# Patient Record
Sex: Female | Born: 1950 | Race: White | Hispanic: No | Marital: Married | Smoking: Never smoker
Health system: Southern US, Community
[De-identification: ages and names within clinical notes are randomized; demographics above are authoritative.]

## PROBLEM LIST (undated history)

## (undated) DIAGNOSIS — IMO0002 Reserved for concepts with insufficient information to code with codable children: Secondary | ICD-10-CM

## (undated) DIAGNOSIS — N950 Postmenopausal bleeding: Secondary | ICD-10-CM

## (undated) DIAGNOSIS — M8580 Other specified disorders of bone density and structure, unspecified site: Secondary | ICD-10-CM

## (undated) DIAGNOSIS — N816 Rectocele: Secondary | ICD-10-CM

## (undated) DIAGNOSIS — G4700 Insomnia, unspecified: Secondary | ICD-10-CM

## (undated) HISTORY — DX: Other specified disorders of bone density and structure, unspecified site: M85.80

## (undated) HISTORY — DX: Rectocele: N81.6

## (undated) HISTORY — DX: Insomnia, unspecified: G47.00

## (undated) HISTORY — DX: Postmenopausal bleeding: N95.0

## (undated) HISTORY — DX: Reserved for concepts with insufficient information to code with codable children: IMO0002

---

## 1999-04-03 ENCOUNTER — Other Ambulatory Visit: Admission: RE | Admit: 1999-04-03 | Discharge: 1999-04-03 | Payer: Self-pay | Admitting: Obstetrics and Gynecology

## 1999-10-16 ENCOUNTER — Encounter: Payer: Self-pay | Admitting: Obstetrics and Gynecology

## 1999-10-16 ENCOUNTER — Encounter: Admission: RE | Admit: 1999-10-16 | Discharge: 1999-10-16 | Payer: Self-pay | Admitting: Obstetrics and Gynecology

## 2000-03-30 ENCOUNTER — Other Ambulatory Visit: Admission: RE | Admit: 2000-03-30 | Discharge: 2000-03-30 | Payer: Self-pay | Admitting: Obstetrics and Gynecology

## 2000-03-31 ENCOUNTER — Other Ambulatory Visit: Admission: RE | Admit: 2000-03-31 | Discharge: 2000-03-31 | Payer: Self-pay | Admitting: Obstetrics and Gynecology

## 2000-03-31 ENCOUNTER — Encounter (INDEPENDENT_AMBULATORY_CARE_PROVIDER_SITE_OTHER): Payer: Self-pay | Admitting: Specialist

## 2000-10-18 ENCOUNTER — Encounter: Admission: RE | Admit: 2000-10-18 | Discharge: 2000-10-18 | Payer: Self-pay | Admitting: Obstetrics and Gynecology

## 2000-10-18 ENCOUNTER — Encounter: Payer: Self-pay | Admitting: Obstetrics and Gynecology

## 2001-04-18 ENCOUNTER — Other Ambulatory Visit: Admission: RE | Admit: 2001-04-18 | Discharge: 2001-04-18 | Payer: Self-pay | Admitting: Obstetrics and Gynecology

## 2001-10-20 ENCOUNTER — Encounter: Admission: RE | Admit: 2001-10-20 | Discharge: 2001-10-20 | Payer: Self-pay | Admitting: Obstetrics and Gynecology

## 2001-10-20 ENCOUNTER — Encounter: Payer: Self-pay | Admitting: Obstetrics and Gynecology

## 2002-05-26 ENCOUNTER — Other Ambulatory Visit: Admission: RE | Admit: 2002-05-26 | Discharge: 2002-05-26 | Payer: Self-pay | Admitting: Obstetrics and Gynecology

## 2002-10-23 ENCOUNTER — Encounter: Payer: Self-pay | Admitting: Obstetrics and Gynecology

## 2002-10-23 ENCOUNTER — Encounter: Admission: RE | Admit: 2002-10-23 | Discharge: 2002-10-23 | Payer: Self-pay | Admitting: Obstetrics and Gynecology

## 2003-05-28 ENCOUNTER — Other Ambulatory Visit: Admission: RE | Admit: 2003-05-28 | Discharge: 2003-05-28 | Payer: Self-pay | Admitting: Obstetrics and Gynecology

## 2003-11-08 ENCOUNTER — Encounter: Admission: RE | Admit: 2003-11-08 | Discharge: 2003-11-08 | Payer: Self-pay | Admitting: Obstetrics and Gynecology

## 2004-04-21 ENCOUNTER — Other Ambulatory Visit: Admission: RE | Admit: 2004-04-21 | Discharge: 2004-04-21 | Payer: Self-pay | Admitting: Obstetrics and Gynecology

## 2004-11-11 ENCOUNTER — Encounter: Admission: RE | Admit: 2004-11-11 | Discharge: 2004-11-11 | Payer: Self-pay | Admitting: Obstetrics and Gynecology

## 2005-02-19 ENCOUNTER — Ambulatory Visit (HOSPITAL_COMMUNITY): Admission: RE | Admit: 2005-02-19 | Discharge: 2005-02-19 | Payer: Self-pay | Admitting: Gastroenterology

## 2005-04-28 ENCOUNTER — Other Ambulatory Visit: Admission: RE | Admit: 2005-04-28 | Discharge: 2005-04-28 | Payer: Self-pay | Admitting: Obstetrics and Gynecology

## 2006-01-01 ENCOUNTER — Encounter: Admission: RE | Admit: 2006-01-01 | Discharge: 2006-01-01 | Payer: Self-pay | Admitting: Obstetrics and Gynecology

## 2006-07-19 ENCOUNTER — Other Ambulatory Visit: Admission: RE | Admit: 2006-07-19 | Discharge: 2006-07-19 | Payer: Self-pay | Admitting: Obstetrics and Gynecology

## 2006-07-20 ENCOUNTER — Encounter: Admission: RE | Admit: 2006-07-20 | Discharge: 2006-07-20 | Payer: Self-pay | Admitting: Obstetrics and Gynecology

## 2007-01-03 ENCOUNTER — Encounter: Admission: RE | Admit: 2007-01-03 | Discharge: 2007-01-03 | Payer: Self-pay | Admitting: Obstetrics and Gynecology

## 2008-01-09 ENCOUNTER — Encounter: Admission: RE | Admit: 2008-01-09 | Discharge: 2008-01-09 | Payer: Self-pay | Admitting: Obstetrics and Gynecology

## 2009-01-09 ENCOUNTER — Encounter: Admission: RE | Admit: 2009-01-09 | Discharge: 2009-01-09 | Payer: Self-pay | Admitting: Obstetrics and Gynecology

## 2010-01-14 ENCOUNTER — Encounter: Admission: RE | Admit: 2010-01-14 | Discharge: 2010-01-14 | Payer: Self-pay | Admitting: Obstetrics and Gynecology

## 2010-12-29 ENCOUNTER — Other Ambulatory Visit: Payer: Self-pay | Admitting: Obstetrics and Gynecology

## 2010-12-29 DIAGNOSIS — Z1239 Encounter for other screening for malignant neoplasm of breast: Secondary | ICD-10-CM

## 2010-12-29 DIAGNOSIS — Z1231 Encounter for screening mammogram for malignant neoplasm of breast: Secondary | ICD-10-CM

## 2011-01-15 ENCOUNTER — Ambulatory Visit: Payer: Self-pay

## 2011-01-15 ENCOUNTER — Ambulatory Visit
Admission: RE | Admit: 2011-01-15 | Discharge: 2011-01-15 | Disposition: A | Discharge status: Home / Self Care | Payer: 59 | Source: Ambulatory Visit | Attending: Obstetrics and Gynecology | Admitting: Obstetrics and Gynecology

## 2011-01-15 DIAGNOSIS — Z1231 Encounter for screening mammogram for malignant neoplasm of breast: Secondary | ICD-10-CM

## 2011-04-24 NOTE — Op Note (Signed)
NAMEDONASIA, WIMES             ACCOUNT NO.:  [id number]   MEDICAL RECORD NO.:  [id number]          PATIENT TYPE:  AMB   LOCATION:  ENDO                         FACILITY:  [facility]   PHYSICIAN:  Bernette Redbird, M.D.   DATE OF BIRTH:  1951-09-28   DATE OF PROCEDURE:  02/19/2005  DATE OF DISCHARGE:                                 OPERATIVE REPORT   PROCEDURE:  Colonoscopy.   ENDOSCOPIST:  Bernette Redbird, M.D.   INDICATIONS:  Initial colon cancer screening exam in a 60 year old female  without worrisome risk factors or symptoms.   FINDINGS:  Normal exam to the terminal ileum.   PROCEDURE:  The nature, purpose and risks of the procedure had been  discussed with the patient, who provided written consent.  Sedation was  fentanyl 100 mcg and Versed 10 mg IV, without arrhythmias or desaturation.  The Olympus adjustable-tension pediatric video colonoscope was advanced with  some looping, overcome by taking out loops and using external abdominal  compression, around the colon to the cecum mass as was clearly identified by  visualization of the appendiceal orifice and pullback was then performed.  The quality of prep was excellent and it was felt that all areas were well-  seen.   This was a normal examination.  No polyps, cancer, colitis, vascular  malformations or diverticulosis were noted and retroflexion in the rectum  was unremarkable, as was re0inspection of the rectum.  No biopsies were  obtained.  She tolerated the procedure well and there no apparent  complications.   IMPRESSION:  Normal screening colonoscopy (V76.51).   PLAN:  Sigmoidoscopic evaluation in 5 years for continued screening.      RB/MEDQ  D:  02/19/2005  T:  02/20/2005  Job:  [id number]   cc:   Janine Limbo, M.D.  [address], [address]  [place]  [place] [address]  Fax: [phone]

## 2011-12-29 ENCOUNTER — Other Ambulatory Visit: Payer: Self-pay | Admitting: Obstetrics and Gynecology

## 2011-12-29 DIAGNOSIS — Z1231 Encounter for screening mammogram for malignant neoplasm of breast: Secondary | ICD-10-CM

## 2012-01-18 ENCOUNTER — Ambulatory Visit
Admission: RE | Admit: 2012-01-18 | Discharge: 2012-01-18 | Disposition: A | Discharge status: Home / Self Care | Payer: 59 | Source: Ambulatory Visit | Attending: Obstetrics and Gynecology | Admitting: Obstetrics and Gynecology

## 2012-01-18 DIAGNOSIS — Z1231 Encounter for screening mammogram for malignant neoplasm of breast: Secondary | ICD-10-CM

## 2012-08-25 ENCOUNTER — Ambulatory Visit (INDEPENDENT_AMBULATORY_CARE_PROVIDER_SITE_OTHER): Payer: 59 | Admitting: Obstetrics and Gynecology

## 2012-08-25 ENCOUNTER — Encounter: Payer: Self-pay | Admitting: Obstetrics and Gynecology

## 2012-08-25 VITALS — BP 132/80 | HR 74 | Ht 67.0 in | Wt 148.0 lb

## 2012-08-25 DIAGNOSIS — Z01419 Encounter for gynecological examination (general) (routine) without abnormal findings: Secondary | ICD-10-CM

## 2012-08-25 DIAGNOSIS — E7800 Pure hypercholesterolemia, unspecified: Secondary | ICD-10-CM

## 2012-08-25 DIAGNOSIS — G4700 Insomnia, unspecified: Secondary | ICD-10-CM

## 2012-08-25 DIAGNOSIS — N951 Menopausal and female climacteric states: Secondary | ICD-10-CM

## 2012-08-25 DIAGNOSIS — Z124 Encounter for screening for malignant neoplasm of cervix: Secondary | ICD-10-CM

## 2012-08-25 DIAGNOSIS — M810 Age-related osteoporosis without current pathological fracture: Secondary | ICD-10-CM | POA: Insufficient documentation

## 2012-08-25 MED ORDER — ZOLPIDEM TARTRATE 10 MG PO TABS: ORAL_TABLET | ORAL | Status: DC

## 2012-08-25 NOTE — Progress Notes (Signed)
Subjective:    Jillian Davidson is a 61 y.o. female G3P3 who presents for annual exam. The patient has a known history of osteoporosis.  She has been treated in the past with alendronate.  Her bones continued to deteriorate.  She was treated with proteinuria 60 mg.  She had a reaction with flulike symptoms and extreme fatigue.  Her mother also has a history of osteoporosis.  The patient has a history of insomnia.  Her LDL cholesterol and total cholesterol are elevated (229, 130). Her HDL cholesterol is 88. Her other lab studies are normal including TSH, vitamin D, CBC, and comprehensive panel.  The following portions of the patient's history were reviewed and updated as appropriate: allergies, current medications, past family history, past medical history, past social history, past surgical history and problem list.  Review of Systems Pertinent items are noted in HPI. Gastrointestinal:No change in bowel habits, no abdominal pain, no rectal bleeding Genitourinary:negative for dysuria, frequency, hematuria, nocturia and urinary incontinence    Objective:     BP 132/80  Pulse 74  Ht 5\' 7"  (1.702 m)  Wt 148 lb (67.132 kg)  BMI 23.18 kg/m2  Weight:  Wt Readings from Last 1 Encounters:  08/25/12 148 lb (67.132 kg)     BMI: Body mass index is 23.18 kg/(m^2). General Appearance: Alert, appropriate appearance for age. No acute distress HEENT: Grossly normal Neck / Thyroid: Supple, no masses, nodes or enlargement Lungs: clear to auscultation bilaterally Back: No CVA tenderness Breast Exam: No masses or nodes.No dimpling, nipple retraction or discharge. Cardiovascular: Regular rate and rhythm. S1, S2, no murmur Gastrointestinal: Soft, non-tender, no masses or organomegaly  ++++++++++++++++++++++++++++++++++++++++++++++++++++++++  Pelvic Exam: External genitalia: normal general appearance Vaginal: rectal patient noted, atrophic Cervix: normal appearance Adnexa: normal bimanual exam Uterus:  normal size shape and consistency Rectovaginal: normal rectal, no masses  ++++++++++++++++++++++++++++++++++++++++++++++++++++++++  Lymphatic Exam: Non-palpable nodes in neck, clavicular, axillary, or inguinal regions  Psychiatric: Alert and oriented, appropriate affect.@OBJECTIVEEN @      Assessment:    Normal gyn exam Menopause insomnia  Osteoporosis Reaction to Prolia Elevated LDL cholesterol and total cholesterol.  Overweight or obese: Yes  Pelvic relaxation: Yes  Menopausal symptoms: Yes. Severe: No.   Plan:    Mammogram. Pap smear.   Follow-up:  for annual exam  Ambien 10 mg daily at bedtime as needed for insomnia.  I will investigate the patient's reaction to Prolia.  I will call her with an alternative treatment for her osteoporosis.  Management of her cholesterol abnormalities was reviewed.  The updated Pap smear screening guidelines were discussed with the patient. The patient requested that I obtain a Pap smear: Yes.  Kegel exercises discussed: Yes.  Proper diet and regular exercise were reviewed.  Annual mammograms recommended starting at age 53. Proper breast care was discussed.  Screening colonoscopy is recommended beginning at age 74.  Regular health maintenance was reviewed.  Sleep hygiene was discussed.  Adequate calcium and vitamin D intake was emphasized.  Leonard Schwartz M.D.   Regular Periods: no Mammogram: yes  Monthly Breast Ex.: yes Exercise: yes walking 1 hr 6 days per week  Tetanus < 10 years: yes Seatbelts: yes  NI. Bladder Functn.: yes Abuse at home: no  Daily BM's: yes Stressful Work: no  Healthy Diet: yes Sigmoid-Colonoscopy: 2011 wnl per pt  Calcium: yes Medical problems this year: none   LAST PAP:08/15/09, Pap due today  Contraception: Post menopausal  Mammogram:  01/19/12 wnl  PCP: none  PMH: none  FMH: none  Last Bone Scan: 09/10/11

## 2012-08-26 LAB — PAP IG W/ RFLX HPV ASCU

## 2012-08-31 ENCOUNTER — Telehealth: Payer: Self-pay | Admitting: Obstetrics and Gynecology

## 2012-08-31 ENCOUNTER — Other Ambulatory Visit: Payer: Self-pay | Admitting: Obstetrics and Gynecology

## 2012-08-31 DIAGNOSIS — G4700 Insomnia, unspecified: Secondary | ICD-10-CM

## 2012-08-31 MED ORDER — ZOLPIDEM TARTRATE 10 MG PO TABS: ORAL_TABLET | ORAL | Status: AC

## 2012-08-31 NOTE — Telephone Encounter (Signed)
Medication correction 

## 2012-08-31 NOTE — Telephone Encounter (Signed)
Tc to pt regarding msg, per AVS ok to change, pt mailed paper RX with correction of quantity.  Pt to call back with any questions.

## 2012-12-19 ENCOUNTER — Other Ambulatory Visit: Payer: Self-pay | Admitting: Obstetrics and Gynecology

## 2012-12-19 DIAGNOSIS — Z1231 Encounter for screening mammogram for malignant neoplasm of breast: Secondary | ICD-10-CM

## 2012-12-21 ENCOUNTER — Telehealth: Payer: Self-pay | Admitting: Obstetrics and Gynecology

## 2012-12-21 NOTE — Telephone Encounter (Signed)
Patient called on her cellular phone number.  She saw Dr. Waynard Edwards who started her on a new medication.  She is tolerating this medication well.  Dr. Stefano Gaul

## 2013-01-19 ENCOUNTER — Ambulatory Visit: Payer: 59

## 2013-02-22 ENCOUNTER — Ambulatory Visit
Admission: RE | Admit: 2013-02-22 | Discharge: 2013-02-22 | Disposition: A | Discharge status: Home / Self Care | Payer: 59 | Source: Ambulatory Visit | Attending: Obstetrics and Gynecology | Admitting: Obstetrics and Gynecology

## 2014-01-17 ENCOUNTER — Other Ambulatory Visit: Payer: Self-pay

## 2014-01-17 DIAGNOSIS — Z1231 Encounter for screening mammogram for malignant neoplasm of breast: Secondary | ICD-10-CM

## 2014-02-23 ENCOUNTER — Ambulatory Visit
Admission: RE | Admit: 2014-02-23 | Discharge: 2014-02-23 | Disposition: A | Discharge status: Home / Self Care | Payer: 59 | Source: Ambulatory Visit

## 2014-02-23 DIAGNOSIS — Z1231 Encounter for screening mammogram for malignant neoplasm of breast: Secondary | ICD-10-CM

## 2014-10-08 ENCOUNTER — Encounter: Payer: Self-pay | Admitting: Obstetrics and Gynecology

## 2015-02-07 ENCOUNTER — Other Ambulatory Visit: Payer: Self-pay

## 2015-02-07 DIAGNOSIS — Z1231 Encounter for screening mammogram for malignant neoplasm of breast: Secondary | ICD-10-CM

## 2015-02-26 ENCOUNTER — Ambulatory Visit
Admission: RE | Admit: 2015-02-26 | Discharge: 2015-02-26 | Disposition: A | Discharge status: Home / Self Care | Payer: Managed Care, Other (non HMO) | Source: Ambulatory Visit

## 2015-02-26 DIAGNOSIS — Z1231 Encounter for screening mammogram for malignant neoplasm of breast: Secondary | ICD-10-CM

## 2016-01-28 ENCOUNTER — Other Ambulatory Visit: Payer: Self-pay

## 2016-01-28 DIAGNOSIS — Z1231 Encounter for screening mammogram for malignant neoplasm of breast: Secondary | ICD-10-CM

## 2016-02-27 ENCOUNTER — Ambulatory Visit: Payer: Managed Care, Other (non HMO)

## 2016-03-09 ENCOUNTER — Ambulatory Visit
Admission: RE | Admit: 2016-03-09 | Discharge: 2016-03-09 | Disposition: A | Discharge status: Home / Self Care | Payer: Managed Care, Other (non HMO) | Source: Ambulatory Visit

## 2016-03-09 DIAGNOSIS — Z1231 Encounter for screening mammogram for malignant neoplasm of breast: Secondary | ICD-10-CM

## 2017-01-20 ENCOUNTER — Encounter (HOSPITAL_COMMUNITY): Payer: Managed Care, Other (non HMO)

## 2017-01-28 ENCOUNTER — Other Ambulatory Visit (HOSPITAL_COMMUNITY): Payer: Self-pay | Admitting: *Deleted

## 2017-01-29 ENCOUNTER — Ambulatory Visit (HOSPITAL_COMMUNITY)
Admission: RE | Admit: 2017-01-29 | Discharge: 2017-01-29 | Disposition: A | Discharge status: Home / Self Care | Payer: Managed Care, Other (non HMO) | Source: Ambulatory Visit | Attending: Internal Medicine | Admitting: Internal Medicine

## 2017-01-29 DIAGNOSIS — M810 Age-related osteoporosis without current pathological fracture: Secondary | ICD-10-CM | POA: Insufficient documentation

## 2017-01-29 MED ORDER — ZOLEDRONIC ACID 5 MG/100ML IV SOLN: 5.0000 mg | Freq: Once | INTRAVENOUS | Status: DC

## 2017-01-29 MED ORDER — ZOLEDRONIC ACID 5 MG/100ML IV SOLN
INTRAVENOUS | Status: AC
  Administered 2017-01-29: 5 mg
  Filled 2017-01-29: qty 100

## 2017-01-29 NOTE — Discharge Instructions (Signed)
Zoledronic Acid injection (Paget's Disease, Osteoporosis) °What is this medicine? °ZOLEDRONIC ACID (ZOE le dron ik AS id) lowers the amount of calcium loss from bone. It is used to treat Paget's disease and osteoporosis in women. °COMMON BRAND NAME(S): Reclast, Zometa °What should I tell my health care provider before I take this medicine? °They need to know if you have any of these conditions: °-aspirin-sensitive asthma °-cancer, especially if you are receiving medicines used to treat cancer °-dental disease or wear dentures °-infection °-kidney disease °-low levels of calcium in the blood °-past surgery on the parathyroid gland or intestines °-receiving corticosteroids like dexamethasone or prednisone °-an unusual or allergic reaction to zoledronic acid, other medicines, foods, dyes, or preservatives °-pregnant or trying to get pregnant °-breast-feeding °How should I use this medicine? °This medicine is for infusion into a vein. It is given by a health care professional in a hospital or clinic setting. °Talk to your pediatrician regarding the use of this medicine in children. This medicine is not approved for use in children. °What if I miss a dose? °It is important not to miss your dose. Call your doctor or health care professional if you are unable to keep an appointment. °What may interact with this medicine? °-certain antibiotics given by injection °-NSAIDs, medicines for pain and inflammation, like ibuprofen or naproxen °-some diuretics like bumetanide, furosemide °-teriparatide °What should I watch for while using this medicine? °Visit your doctor or health care professional for regular checkups. It may be some time before you see the benefit from this medicine. Do not stop taking your medicine unless your doctor tells you to. Your doctor may order blood tests or other tests to see how you are doing. °Women should inform their doctor if they wish to become pregnant or think they might be pregnant. There is a  potential for serious side effects to an unborn child. Talk to your health care professional or pharmacist for more information. °You should make sure that you get enough calcium and vitamin D while you are taking this medicine. Discuss the foods you eat and the vitamins you take with your health care professional. °Some people who take this medicine have severe bone, joint, and/or muscle pain. This medicine may also increase your risk for jaw problems or a broken thigh bone. Tell your doctor right away if you have severe pain in your jaw, bones, joints, or muscles. Tell your doctor if you have any pain that does not go away or that gets worse. °Tell your dentist and dental surgeon that you are taking this medicine. You should not have major dental surgery while on this medicine. See your dentist to have a dental exam and fix any dental problems before starting this medicine. Take good care of your teeth while on this medicine. Make sure you see your dentist for regular follow-up appointments. °What side effects may I notice from receiving this medicine? °Side effects that you should report to your doctor or health care professional as soon as possible: °-allergic reactions like skin rash, itching or hives, swelling of the face, lips, or tongue °-anxiety, confusion, or depression °-breathing problems °-changes in vision °-eye pain °-feeling faint or lightheaded, falls °-jaw pain, especially after dental work °-mouth sores °-muscle cramps, stiffness, or weakness °-redness, blistering, peeling or loosening of the skin, including inside the mouth °-trouble passing urine or change in the amount of urine °Side effects that usually do not require medical attention (report to your doctor or health care professional if   they continue or are bothersome): °-bone, joint, or muscle pain °-constipation °-diarrhea °-fever °-hair loss °-irritation at site where injected °-loss of appetite °-nausea, vomiting °-stomach  upset °-trouble sleeping °-trouble swallowing °-weak or tired °Where should I keep my medicine? °This drug is given in a hospital or clinic and will not be stored at home. °© 2017 [employer]/Gold Standard (2014-04-21 14:19:57) ° °

## 2017-02-01 ENCOUNTER — Other Ambulatory Visit: Payer: Self-pay | Admitting: Obstetrics and Gynecology

## 2017-02-01 DIAGNOSIS — Z1231 Encounter for screening mammogram for malignant neoplasm of breast: Secondary | ICD-10-CM

## 2017-03-10 ENCOUNTER — Ambulatory Visit
Admission: RE | Admit: 2017-03-10 | Discharge: 2017-03-10 | Disposition: A | Discharge status: Home / Self Care | Payer: Managed Care, Other (non HMO) | Source: Ambulatory Visit | Attending: Obstetrics and Gynecology | Admitting: Obstetrics and Gynecology

## 2017-03-10 DIAGNOSIS — Z1231 Encounter for screening mammogram for malignant neoplasm of breast: Secondary | ICD-10-CM

## 2018-02-21 ENCOUNTER — Other Ambulatory Visit: Payer: Self-pay | Admitting: Obstetrics and Gynecology

## 2018-02-21 DIAGNOSIS — Z139 Encounter for screening, unspecified: Secondary | ICD-10-CM

## 2018-03-11 ENCOUNTER — Ambulatory Visit
Admission: RE | Admit: 2018-03-11 | Discharge: 2018-03-11 | Disposition: A | Discharge status: Home / Self Care | Payer: Managed Care, Other (non HMO) | Source: Ambulatory Visit | Attending: Obstetrics and Gynecology | Admitting: Obstetrics and Gynecology

## 2018-03-11 DIAGNOSIS — Z139 Encounter for screening, unspecified: Secondary | ICD-10-CM

## 2019-02-03 ENCOUNTER — Other Ambulatory Visit: Payer: Self-pay | Admitting: Internal Medicine

## 2019-02-03 DIAGNOSIS — Z1231 Encounter for screening mammogram for malignant neoplasm of breast: Secondary | ICD-10-CM

## 2019-03-13 ENCOUNTER — Ambulatory Visit: Payer: Managed Care, Other (non HMO)

## 2019-04-24 ENCOUNTER — Ambulatory Visit
Admission: RE | Admit: 2019-04-24 | Discharge: 2019-04-24 | Disposition: A | Discharge status: Home / Self Care | Payer: Medicare Other | Source: Ambulatory Visit | Attending: Internal Medicine | Admitting: Internal Medicine

## 2019-04-24 ENCOUNTER — Other Ambulatory Visit: Payer: Self-pay

## 2019-04-24 DIAGNOSIS — Z1231 Encounter for screening mammogram for malignant neoplasm of breast: Secondary | ICD-10-CM

## 2019-12-13 ENCOUNTER — Ambulatory Visit: Payer: Medicare Other | Attending: Internal Medicine

## 2019-12-13 DIAGNOSIS — Z20822 Contact with and (suspected) exposure to covid-19: Secondary | ICD-10-CM

## 2019-12-14 LAB — NOVEL CORONAVIRUS, NAA: SARS-CoV-2, NAA: NOT DETECTED

## 2020-01-05 ENCOUNTER — Ambulatory Visit: Payer: Medicare Other

## 2020-01-10 ENCOUNTER — Ambulatory Visit: Payer: Medicare Other

## 2020-04-09 ENCOUNTER — Other Ambulatory Visit: Payer: Self-pay | Admitting: Internal Medicine

## 2020-04-09 DIAGNOSIS — Z1231 Encounter for screening mammogram for malignant neoplasm of breast: Secondary | ICD-10-CM

## 2020-05-09 ENCOUNTER — Other Ambulatory Visit: Payer: Self-pay

## 2020-05-09 ENCOUNTER — Ambulatory Visit
Admission: RE | Admit: 2020-05-09 | Discharge: 2020-05-09 | Disposition: A | Discharge status: Home / Self Care | Payer: Medicare Other | Source: Ambulatory Visit

## 2020-05-09 DIAGNOSIS — Z1231 Encounter for screening mammogram for malignant neoplasm of breast: Secondary | ICD-10-CM

## 2021-04-15 ENCOUNTER — Other Ambulatory Visit: Payer: Self-pay | Admitting: Internal Medicine

## 2021-04-15 DIAGNOSIS — Z1231 Encounter for screening mammogram for malignant neoplasm of breast: Secondary | ICD-10-CM

## 2021-06-16 ENCOUNTER — Other Ambulatory Visit: Payer: Self-pay

## 2021-06-16 ENCOUNTER — Ambulatory Visit
Admission: RE | Admit: 2021-06-16 | Discharge: 2021-06-16 | Disposition: A | Discharge status: Home / Self Care | Payer: Medicare Other | Source: Ambulatory Visit

## 2021-06-16 DIAGNOSIS — Z1231 Encounter for screening mammogram for malignant neoplasm of breast: Secondary | ICD-10-CM

## 2022-01-27 ENCOUNTER — Other Ambulatory Visit: Payer: Self-pay | Admitting: Internal Medicine

## 2022-01-27 DIAGNOSIS — E785 Hyperlipidemia, unspecified: Secondary | ICD-10-CM

## 2022-03-02 ENCOUNTER — Ambulatory Visit
Admission: RE | Admit: 2022-03-02 | Discharge: 2022-03-02 | Disposition: A | Discharge status: Home / Self Care | Payer: No Typology Code available for payment source | Source: Ambulatory Visit | Attending: Internal Medicine | Admitting: Internal Medicine

## 2022-03-02 DIAGNOSIS — E785 Hyperlipidemia, unspecified: Secondary | ICD-10-CM

## 2022-05-29 ENCOUNTER — Other Ambulatory Visit: Payer: Self-pay | Admitting: Internal Medicine

## 2022-05-29 DIAGNOSIS — Z1231 Encounter for screening mammogram for malignant neoplasm of breast: Secondary | ICD-10-CM

## 2022-06-18 ENCOUNTER — Ambulatory Visit
Admission: RE | Admit: 2022-06-18 | Discharge: 2022-06-18 | Disposition: A | Discharge status: Home / Self Care | Payer: Medicare Other | Source: Ambulatory Visit

## 2022-06-18 DIAGNOSIS — Z1231 Encounter for screening mammogram for malignant neoplasm of breast: Secondary | ICD-10-CM

## 2022-11-29 IMAGING — CT CT CARDIAC CORONARY ARTERY CALCIUM SCORE
3 series · 14 of 20 positions shown, 16 images · non-contrast
Comparison: None.

CLINICAL DATA: 70-year-old Caucasian female with history of
hyperlipidemia.

EXAM:
CT CARDIAC CORONARY ARTERY CALCIUM SCORE
TECHNIQUE: Non-contrast imaging through the heart was performed using
prospective ECG gating. Image post processing was performed on an
independent workstation, allowing for quantitative analysis of the
heart and coronary arteries. Note that this exam targets the heart
and the chest was not imaged in its entirety.

[Series 2: calcium scoring 2.00 qr36 bestdiast 69% hrt calciu · axial · 0.36mm/px · z∈[+1705,+1789]mm · 4 of 70 slices shown]
[im 14/70  vessel]
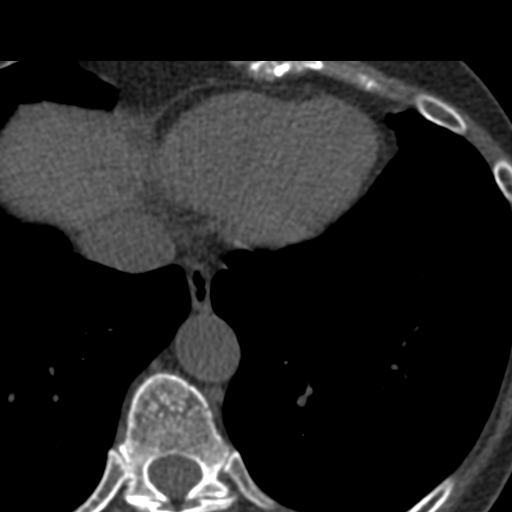
[im 28/70  vessel]
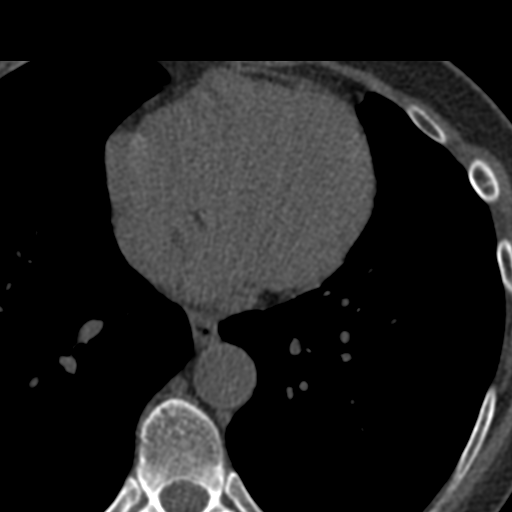
[im 42/70  vessel]
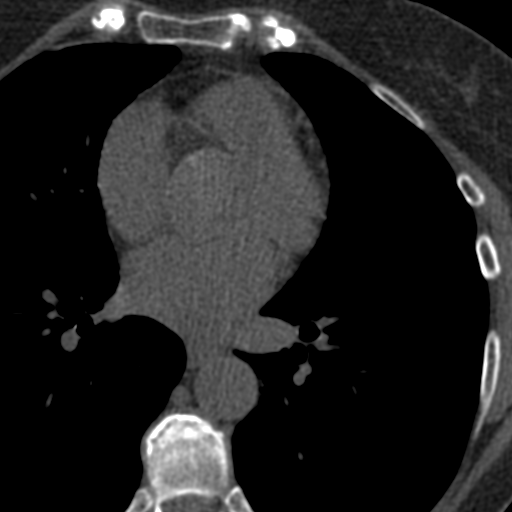
[im 56/70  vessel]
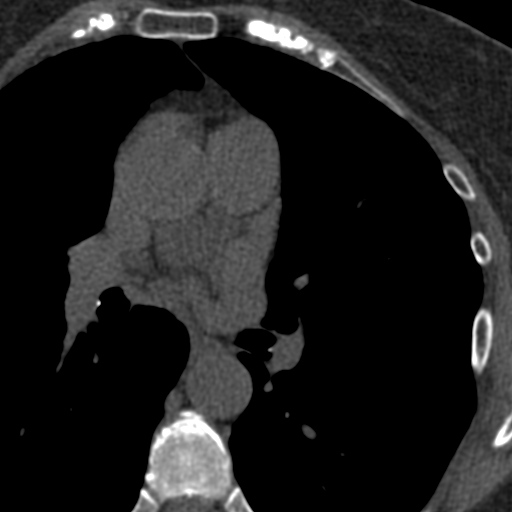

[Series 3: calcium scoring 2.00 br40 bestdiast 69% axial · axial · 0.50mm/px · z∈[+1701,+1793]mm · 5 of 70 slices shown, 7 images]
[im 12/70  vessel]
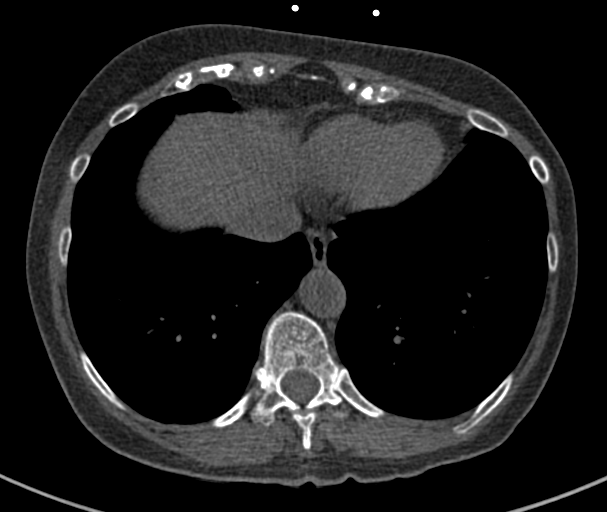
[im 12/70  lung]
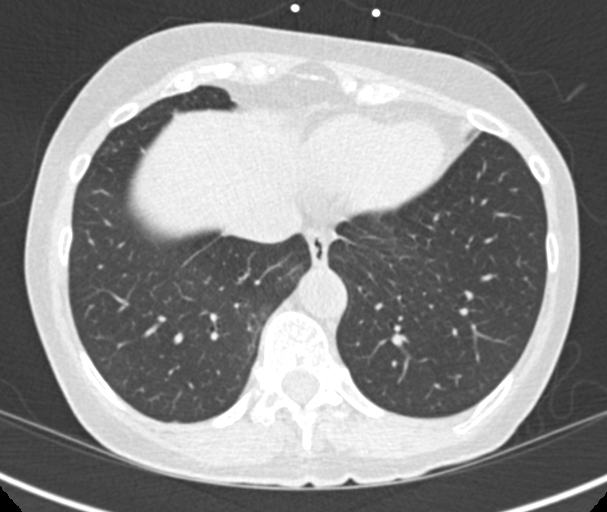
[im 24/70  vessel]
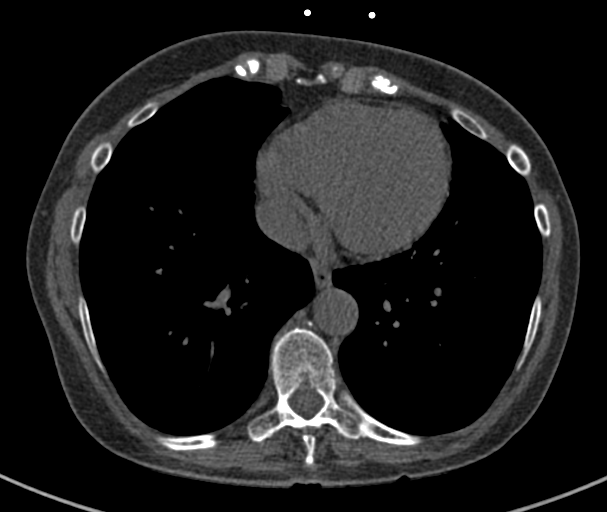
[im 35/70  vessel]
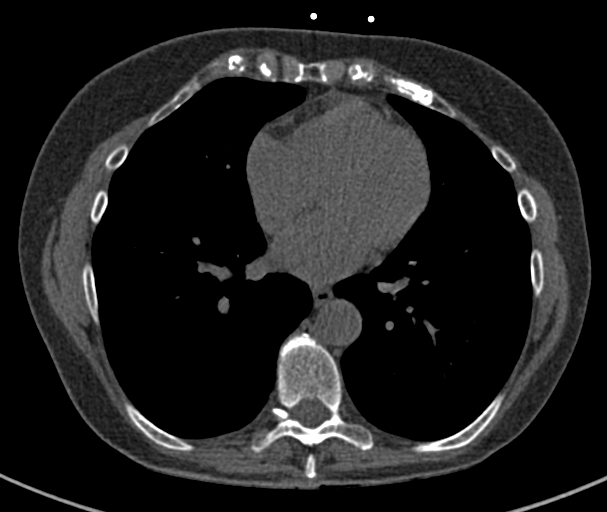
[im 47/70  vessel]
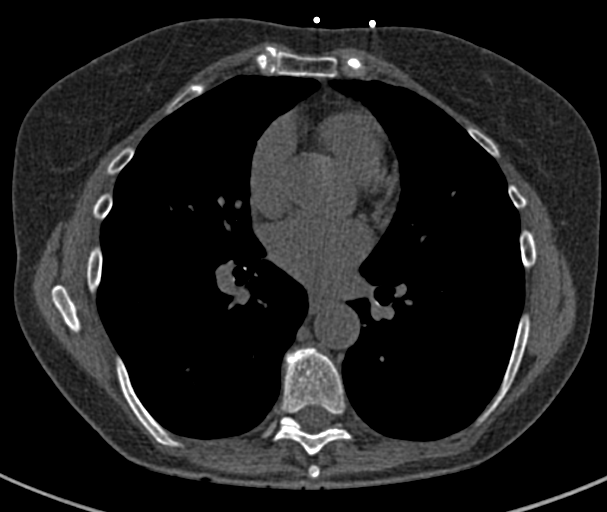
[im 58/70  vessel]
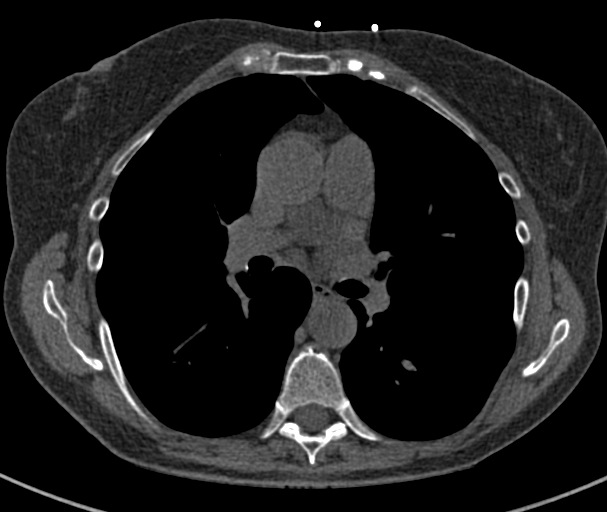
[im 58/70  lung]
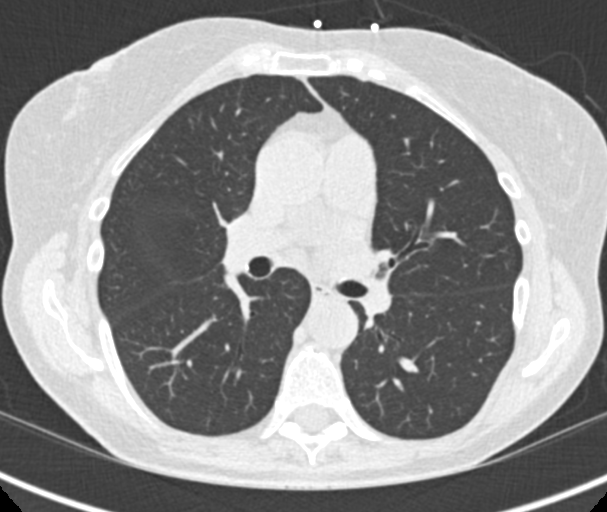

[Series 9: calcium scoring 2.00 br60 bestdiast 69% lungs · axial · 0.50mm/px · z∈[+1701,+1793]mm · 5 of 70 slices shown]
[im 12/70  vessel]
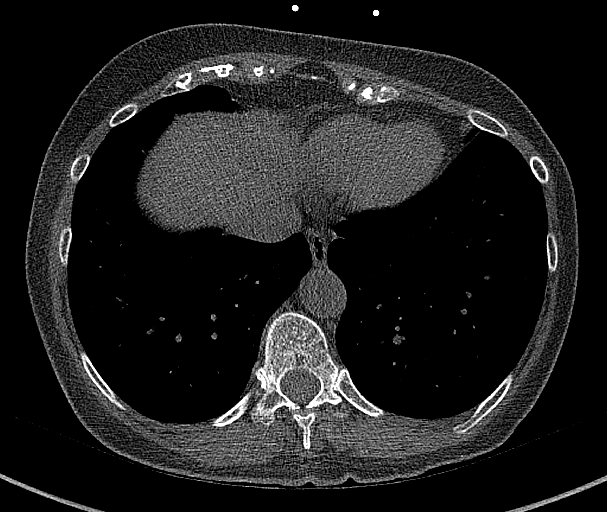
[im 24/70  vessel]
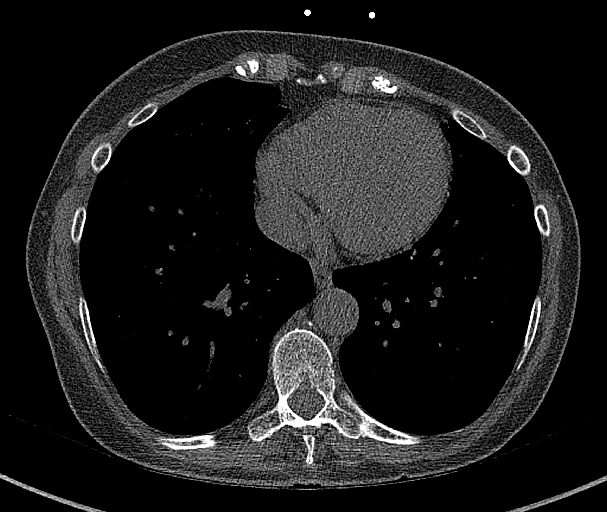
[im 35/70  vessel]
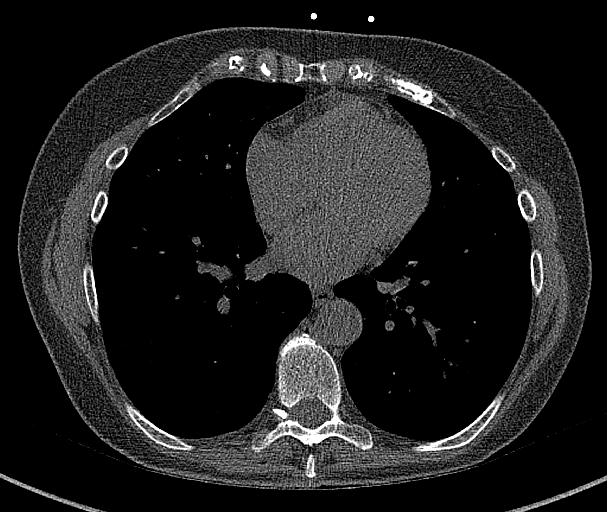
[im 47/70  vessel]
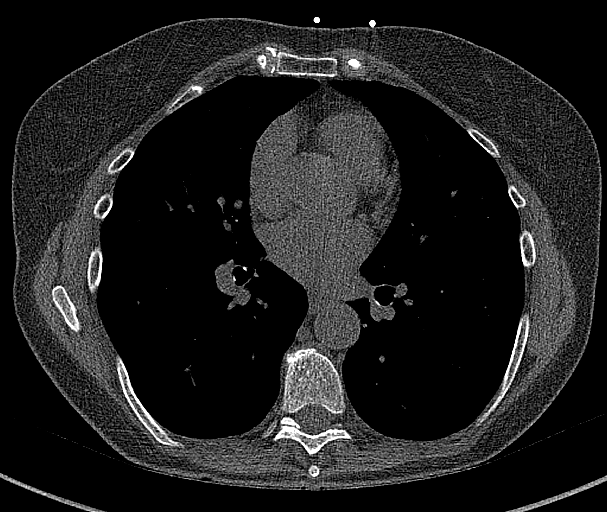
[im 58/70  vessel]
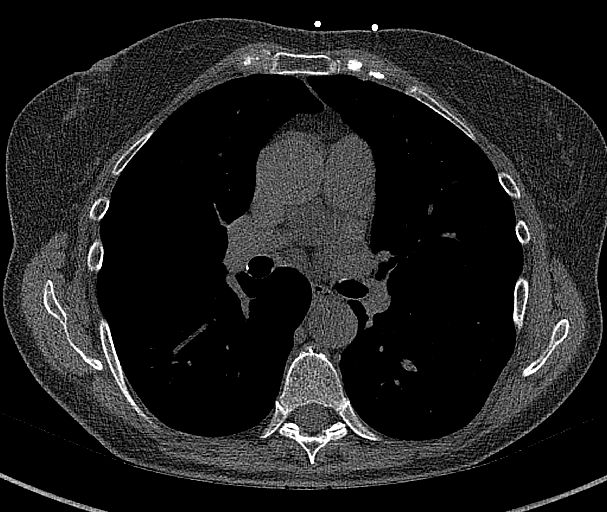

[14 of 20 positions shown; findings below may reference images not displayed]

FINDINGS: CORONARY CALCIUM SCORES:

Left Main: 0

LAD: 0

LCx: 0

RCA: 0

Total Agatston Score: 0

[HOSPITAL] percentile: 0

AORTA MEASUREMENTS:

Ascending Aorta: 32 mm

Descending Aorta: 23 mm

OTHER FINDINGS:

The heart size is within normal limits. No pericardial fluid is
identified. The thoracic aorta demonstrates mild atherosclerosis.
Visualized segments of the thoracic aorta and central pulmonary
arteries are normal in caliber. Visualized mediastinum and hilar
regions demonstrate no lymphadenopathy or masses. Visualized lungs
show no evidence of pulmonary edema, consolidation, pneumothorax,
nodule or pleural fluid. Visualized upper abdomen and bony
structures are unremarkable.
IMPRESSION: 1. Coronary calcium score of 0.
2. Mild atherosclerosis of the thoracic aorta.

## 2023-02-10 ENCOUNTER — Other Ambulatory Visit (HOSPITAL_COMMUNITY): Payer: Self-pay | Admitting: *Deleted

## 2023-02-11 NOTE — Progress Notes (Signed)
Contacted Jillian Davidson at Dr Perini's office regarding prolia orders.  It was written that she received prolia in Oct 2023 but I could not find where she had an appt with [place] for prolia.  I did see where she had reclast in 2018 however.  Jillian Davidson, at the office, stated they are changing the patient to prolia, and she did not receive it last year at all because the patient decided to put it off, so this appt for prolia on 3/7 is her first prolia injection.

## 2023-02-12 ENCOUNTER — Encounter (HOSPITAL_COMMUNITY)
Admission: RE | Admit: 2023-02-12 | Discharge: 2023-02-12 | Disposition: A | Discharge status: Home / Self Care | Payer: Medicare Other | Source: Ambulatory Visit | Attending: Internal Medicine | Admitting: Internal Medicine

## 2023-02-12 DIAGNOSIS — M810 Age-related osteoporosis without current pathological fracture: Secondary | ICD-10-CM | POA: Diagnosis present

## 2023-02-12 MED ORDER — DENOSUMAB 60 MG/ML ~~LOC~~ SOSY
60.0000 mg | PREFILLED_SYRINGE | Freq: Once | SUBCUTANEOUS | Status: AC
  Administered 2023-02-12: 60 mg via SUBCUTANEOUS

## 2023-02-12 MED ORDER — DENOSUMAB 60 MG/ML ~~LOC~~ SOSY
PREFILLED_SYRINGE | SUBCUTANEOUS | Status: AC
  Filled 2023-02-12: qty 1

## 2023-05-06 ENCOUNTER — Other Ambulatory Visit: Payer: Self-pay | Admitting: Internal Medicine

## 2023-05-06 DIAGNOSIS — Z1231 Encounter for screening mammogram for malignant neoplasm of breast: Secondary | ICD-10-CM

## 2023-06-22 DIAGNOSIS — Z1231 Encounter for screening mammogram for malignant neoplasm of breast: Secondary | ICD-10-CM

## 2023-07-01 ENCOUNTER — Ambulatory Visit
Admission: RE | Admit: 2023-07-01 | Discharge: 2023-07-01 | Disposition: A | Discharge status: Home / Self Care | Payer: Medicare Other | Source: Ambulatory Visit | Attending: Internal Medicine | Admitting: Internal Medicine

## 2023-07-01 DIAGNOSIS — Z1231 Encounter for screening mammogram for malignant neoplasm of breast: Secondary | ICD-10-CM

## 2023-09-13 ENCOUNTER — Other Ambulatory Visit (HOSPITAL_COMMUNITY): Payer: Self-pay | Admitting: *Deleted

## 2023-09-15 ENCOUNTER — Ambulatory Visit (HOSPITAL_COMMUNITY)
Admission: RE | Admit: 2023-09-15 | Discharge: 2023-09-15 | Disposition: A | Discharge status: Home / Self Care | Payer: Medicare Other | Source: Ambulatory Visit | Attending: Internal Medicine | Admitting: Internal Medicine

## 2023-09-15 DIAGNOSIS — M810 Age-related osteoporosis without current pathological fracture: Secondary | ICD-10-CM | POA: Insufficient documentation

## 2023-09-15 MED ORDER — DENOSUMAB 60 MG/ML ~~LOC~~ SOSY
PREFILLED_SYRINGE | SUBCUTANEOUS | Status: AC
  Filled 2023-09-15: qty 1

## 2023-09-15 MED ORDER — DENOSUMAB 60 MG/ML ~~LOC~~ SOSY
60.0000 mg | PREFILLED_SYRINGE | Freq: Once | SUBCUTANEOUS | Status: AC
  Administered 2023-09-15: 60 mg via SUBCUTANEOUS

## 2024-04-27 ENCOUNTER — Other Ambulatory Visit (HOSPITAL_COMMUNITY): Payer: Self-pay

## 2024-04-28 ENCOUNTER — Ambulatory Visit (HOSPITAL_COMMUNITY)
Admission: RE | Admit: 2024-04-28 | Discharge: 2024-04-28 | Disposition: A | Discharge status: Home / Self Care | Source: Ambulatory Visit | Attending: Internal Medicine | Admitting: Internal Medicine

## 2024-04-28 DIAGNOSIS — M810 Age-related osteoporosis without current pathological fracture: Secondary | ICD-10-CM | POA: Insufficient documentation

## 2024-04-28 DIAGNOSIS — Z79620 Long term (current) use of immunosuppressive biologic: Secondary | ICD-10-CM | POA: Diagnosis not present

## 2024-04-28 MED ORDER — DENOSUMAB 60 MG/ML ~~LOC~~ SOSY
PREFILLED_SYRINGE | SUBCUTANEOUS | Status: AC
  Filled 2024-04-28: qty 1

## 2024-04-28 MED ORDER — DENOSUMAB 60 MG/ML ~~LOC~~ SOSY
60.0000 mg | PREFILLED_SYRINGE | Freq: Once | SUBCUTANEOUS | Status: AC
  Administered 2024-04-28: 60 mg via SUBCUTANEOUS

## 2024-05-23 ENCOUNTER — Other Ambulatory Visit: Payer: Self-pay | Admitting: Internal Medicine

## 2024-05-23 DIAGNOSIS — Z1231 Encounter for screening mammogram for malignant neoplasm of breast: Secondary | ICD-10-CM

## 2024-07-04 ENCOUNTER — Ambulatory Visit
Admission: RE | Admit: 2024-07-04 | Discharge: 2024-07-04 | Disposition: A | Discharge status: Home / Self Care | Source: Ambulatory Visit

## 2024-07-04 DIAGNOSIS — Z1231 Encounter for screening mammogram for malignant neoplasm of breast: Secondary | ICD-10-CM

## 2024-10-10 ENCOUNTER — Telehealth (HOSPITAL_COMMUNITY): Payer: Self-pay | Admitting: Pharmacy Technician

## 2024-10-10 ENCOUNTER — Other Ambulatory Visit (HOSPITAL_COMMUNITY): Payer: Self-pay | Admitting: Internal Medicine

## 2024-10-10 NOTE — Telephone Encounter (Signed)
 Auth Submission: NO AUTH NEEDED Site of care: [facility] Payer: Medicare A/B, [employer] Supp   Medication & CPT/J Code(s) submitted: Prolia  (Denosumab ) [id number] Diagnosis Code: M81.0 Route of submission (phone, fax, portal):  Phone # Fax # Auth type: Buy/Bill HB Units/visits requested: 60mg  x 2 doses, q 6 months Reference number:  Approval from: 10/10/2024 to 10/10/25    Dagoberto Armour, CPhT [facility] Phone: [phone] 10/10/2024

## 2024-10-31 ENCOUNTER — Ambulatory Visit (HOSPITAL_COMMUNITY)
Admission: RE | Admit: 2024-10-31 | Discharge: 2024-10-31 | Disposition: A | Discharge status: Home / Self Care | Source: Ambulatory Visit | Attending: Internal Medicine | Admitting: Internal Medicine

## 2024-10-31 VITALS — BP 134/74 | HR 91 | Temp 97.3°F | Resp 15

## 2024-10-31 DIAGNOSIS — Z79620 Long term (current) use of immunosuppressive biologic: Secondary | ICD-10-CM | POA: Insufficient documentation

## 2024-10-31 DIAGNOSIS — M810 Age-related osteoporosis without current pathological fracture: Secondary | ICD-10-CM | POA: Diagnosis present

## 2024-10-31 MED ORDER — DENOSUMAB 60 MG/ML ~~LOC~~ SOSY
60.0000 mg | PREFILLED_SYRINGE | Freq: Once | SUBCUTANEOUS | Status: AC
  Administered 2024-10-31: 60 mg via SUBCUTANEOUS

## 2024-10-31 MED ORDER — DENOSUMAB 60 MG/ML ~~LOC~~ SOSY
PREFILLED_SYRINGE | SUBCUTANEOUS | Status: AC
  Filled 2024-10-31: qty 1

## 2025-05-01 ENCOUNTER — Encounter (HOSPITAL_COMMUNITY)
